# Patient Record
Sex: Male | Born: 2000 | Race: White | Hispanic: Yes | Marital: Single | Smoking: Never smoker
Health system: Southern US, Community
[De-identification: ages and names within clinical notes are randomized; demographics above are authoritative.]

---
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
## 2018-09-09 DIAGNOSIS — H40033 Anatomical narrow angle, bilateral: Secondary | ICD-10-CM | POA: Diagnosis not present

## 2018-09-09 DIAGNOSIS — H16223 Keratoconjunctivitis sicca, not specified as Sjogren's, bilateral: Secondary | ICD-10-CM | POA: Diagnosis not present

## 2018-09-23 DIAGNOSIS — H5213 Myopia, bilateral: Secondary | ICD-10-CM | POA: Diagnosis not present

## 2019-01-31 ENCOUNTER — Other Ambulatory Visit: Payer: Self-pay | Admitting: *Deleted

## 2019-01-31 DIAGNOSIS — Z20822 Contact with and (suspected) exposure to covid-19: Secondary | ICD-10-CM

## 2019-02-02 ENCOUNTER — Encounter: Payer: Self-pay | Admitting: Critical Care Medicine

## 2019-02-02 LAB — NOVEL CORONAVIRUS, NAA: SARS-CoV-2, NAA: NOT DETECTED

## 2019-08-30 ENCOUNTER — Encounter (HOSPITAL_COMMUNITY): Payer: Self-pay | Admitting: Emergency Medicine

## 2019-08-30 ENCOUNTER — Emergency Department (HOSPITAL_COMMUNITY)
Admission: EM | Admit: 2019-08-30 | Discharge: 2019-08-30 | Disposition: A | Discharge status: Home / Self Care | Payer: Medicaid Other | Attending: Emergency Medicine | Admitting: Emergency Medicine

## 2019-08-30 ENCOUNTER — Other Ambulatory Visit: Payer: Self-pay

## 2019-08-30 DIAGNOSIS — S0501XA Injury of conjunctiva and corneal abrasion without foreign body, right eye, initial encounter: Secondary | ICD-10-CM | POA: Diagnosis not present

## 2019-08-30 DIAGNOSIS — Y92511 Restaurant or cafe as the place of occurrence of the external cause: Secondary | ICD-10-CM | POA: Diagnosis not present

## 2019-08-30 DIAGNOSIS — S0591XA Unspecified injury of right eye and orbit, initial encounter: Secondary | ICD-10-CM

## 2019-08-30 DIAGNOSIS — Y999 Unspecified external cause status: Secondary | ICD-10-CM | POA: Insufficient documentation

## 2019-08-30 DIAGNOSIS — Z791 Long term (current) use of non-steroidal anti-inflammatories (NSAID): Secondary | ICD-10-CM | POA: Insufficient documentation

## 2019-08-30 DIAGNOSIS — Y939 Activity, unspecified: Secondary | ICD-10-CM | POA: Diagnosis not present

## 2019-08-30 DIAGNOSIS — W228XXA Striking against or struck by other objects, initial encounter: Secondary | ICD-10-CM | POA: Diagnosis not present

## 2019-08-30 MED ORDER — FLUORESCEIN SODIUM 1 MG OP STRP
1.0000 | ORAL_STRIP | Freq: Once | OPHTHALMIC | Status: AC
  Administered 2019-08-30: 23:00:00 1 via OPHTHALMIC

## 2019-08-30 MED ORDER — ERYTHROMYCIN 5 MG/GM OP OINT: TOPICAL_OINTMENT | OPHTHALMIC | 0 refills | Status: AC

## 2019-08-30 MED ORDER — TETRACAINE HCL 0.5 % OP SOLN
2.0000 [drp] | Freq: Once | OPHTHALMIC | Status: AC
  Administered 2019-08-30: 2 [drp] via OPHTHALMIC

## 2019-08-30 NOTE — ED Provider Notes (Signed)
[facility] EMERGENCY DEPARTMENT Provider Note   CSN: [id number] Arrival date & time: 08/30/19  2124     History Chief Complaint  Patient presents with  . Eye Injury    Clayton Taylor is a 19 y.o. male.  The history is provided by the patient. No language interpreter was used.  Eye Injury     19 year old male brought here for evaluation of eye injury.  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  He report it dislodged his contact lens and he report acute onset of sharp pain to his eye.  Pain is moderate in severity, increased with movement and he is afraid to look through his eye.  He is up-to-date with tetanus.  He denies any other injury.  No specific treatment tried.  History reviewed. No pertinent past medical history.  There are no problems to display for this patient.   History reviewed. No pertinent surgical history.     No family history on file.  Social History   Tobacco Use  . Smoking status: Never Smoker  . Smokeless tobacco: Never Used  Substance Use Topics  . Alcohol use: Never  . Drug use: Never    Home Medications Prior to Admission medications   Medication Sig Start Date End Date Taking? Authorizing Provider  acetaminophen (TYLENOL) 500 MG tablet Take 1 tablet (500 mg total) by mouth every 6 (six) hours as needed. 06/12/14   Piepenbrink, Anderson Malta, PA-C  ibuprofen (ADVIL,MOTRIN) 800 MG tablet Take 1 tablet (800 mg total) by mouth 3 (three) times daily. 06/12/14   Piepenbrink, Anderson Malta, PA-C    Allergies    Patient has no known allergies.  Review of Systems   Review of Systems  Constitutional: Negative for fever.  Eyes: Positive for photophobia, pain and redness. Negative for discharge and itching.    Physical Exam Updated Vital Signs BP 124/71 (BP Location: Left  Arm)   Pulse 73   Temp 98.4 F (36.9 C) (Oral)   Resp 16   SpO2 99%   Physical Exam Vitals and nursing note reviewed.  Constitutional:      General: He is not in acute distress.    Appearance: He is well-developed.  HENT:     Head: Atraumatic.  Eyes:     General: Lids are normal. Lids are everted, no foreign bodies appreciated. Gaze aligned appropriately. No visual field deficit.       Right eye: No foreign body, discharge or hordeolum.        Left eye: No foreign body, discharge or hordeolum.     Extraocular Movements: Extraocular movements intact.     Right eye: Normal extraocular motion.     Left eye: Normal extraocular motion.     Conjunctiva/sclera:     Right eye: Right conjunctiva is injected. No chemosis, exudate or hemorrhage.    Left eye: Left conjunctiva is not injected. No chemosis, exudate or hemorrhage.    Pupils: Pupils are equal, round, and reactive to light. Pupils are equal.     Right eye: Pupil is round, reactive and not sluggish. No corneal abrasion or fluorescein uptake. Seidel exam negative.     Slit lamp exam:    Right eye: No foreign body, hyphema or hypopyon.  Musculoskeletal:     Cervical back: Neck supple.  Skin:    Findings:  No rash.  Neurological:     Mental Status: He is alert.     ED Results / Procedures / Treatments   Labs (all labs ordered are listed, but only abnormal results are displayed) Labs Reviewed - No data to display  EKG None  Radiology No results found.  Procedures Procedures (including critical care time)  Medications Ordered in ED Medications  tetracaine (PONTOCAINE) 0.5 % ophthalmic solution 2 drop (has no administration in time range)  fluorescein ophthalmic strip 1 strip (has no administration in time range)    ED Course  I have reviewed the triage vital signs and the nursing notes.  Pertinent labs & imaging results that were available during my care of the patient were reviewed by me and considered in my  medical decision making (see chart for details).    MDM Rules/Calculators/A&P                      BP 124/71 (BP Location: Left Arm)   Pulse 73   Temp 98.4 F (36.9 C) (Oral)   Resp 16   SpO2 99%   Final Clinical Impression(s) / ED Diagnoses Final diagnoses:  Abrasion of right cornea, initial encounter  Right eye injury, initial encounter    Rx / DC Orders ED Discharge Orders         Ordered    erythromycin ophthalmic ointment     08/30/19 2310         10:23 PM Pt was accidentally poke in the eye with a drumstick prior to arrival, which dislodged his contact lens.  Pt report his vision is poor without his contact lens. He also report transient vision loss lasting nearly 2 minutes. On initial exam, no limited ROM of eye movement to suggest nerve or muscle injury.   11:04 PM After fluorescein stain pt felt better, visual is back to his baseline.  No obvious corneal abrasion noted.  Negative Seidel sign, doubt globe injury.  Normal reactive pupil.   After discussing with pt, agree to prescribe erythromycin ointment as prevention of eye infection s/p eye injury.  Recommend resume using contact lens but if he notice any evidence of impaired vision then to promptly f/u with ophthalmologist.  Currently pt report vision is at his baseline when not wearing contact lens.     Fayrene Helper, PA-C 08/30/19 2314    Sabas Sous, MD 09/03/19 [phone]

## 2019-08-30 NOTE — Discharge Instructions (Signed)
Apply erythromycin ointment as prescribed on affected eye to prevent eye infection.  Resume using your contact lens but if you notice vision changes then follow up closely with eye specialist for further evaluation.

## 2019-08-30 NOTE — ED Triage Notes (Signed)
Patient accidentally hit by a drumstick at right eye while at work this evening , no vision , reports pain with blurred vision/lacrimation .

## 2019-08-30 NOTE — ED Notes (Signed)
Visual acuity screening complete. Left eye 20/25 Right eye unable to see anything due to no contact lenses

## 2019-10-08 DIAGNOSIS — H1013 Acute atopic conjunctivitis, bilateral: Secondary | ICD-10-CM | POA: Diagnosis not present

## 2019-11-23 DIAGNOSIS — G44209 Tension-type headache, unspecified, not intractable: Secondary | ICD-10-CM | POA: Diagnosis not present

## 2019-11-23 DIAGNOSIS — H40033 Anatomical narrow angle, bilateral: Secondary | ICD-10-CM | POA: Diagnosis not present

## 2019-12-19 DIAGNOSIS — H04123 Dry eye syndrome of bilateral lacrimal glands: Secondary | ICD-10-CM | POA: Diagnosis not present

## 2019-12-20 DIAGNOSIS — H5213 Myopia, bilateral: Secondary | ICD-10-CM | POA: Diagnosis not present

## 2022-01-17 ENCOUNTER — Ambulatory Visit (HOSPITAL_COMMUNITY)
Admission: EM | Admit: 2022-01-17 | Discharge: 2022-01-17 | Disposition: A | Discharge status: Home / Self Care | Payer: Medicaid Other | Attending: Family Medicine | Admitting: Family Medicine

## 2022-01-17 ENCOUNTER — Encounter (HOSPITAL_COMMUNITY): Payer: Self-pay

## 2022-01-17 DIAGNOSIS — H6121 Impacted cerumen, right ear: Secondary | ICD-10-CM

## 2022-01-17 DIAGNOSIS — H60501 Unspecified acute noninfective otitis externa, right ear: Secondary | ICD-10-CM | POA: Diagnosis not present

## 2022-01-17 MED ORDER — AMOXICILLIN 875 MG PO TABS: 875.0000 mg | ORAL_TABLET | Freq: Two times a day (BID) | ORAL | 0 refills | Status: AC

## 2022-01-17 NOTE — ED Triage Notes (Signed)
Patient having ear fullness and decreased hearing in the right ear. States the first few days it was painful but now just feels full.   Has a history of a hole in the left ear. Would like both ears checked.

## 2022-01-17 NOTE — ED Provider Notes (Signed)
[facility]    CSN: [id number] Arrival date & time: 01/17/22  1233      History   Chief Complaint Chief Complaint  Patient presents with   Otalgia    HPI Clayton Taylor is a 21 y.o. male.   HPI Patient presents for  evaluation of right ear pain and diminished hearing x 1 week. 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 He denies any recent URI illness or allergy symptoms. No drainage from the right ear or fever.  History reviewed. No pertinent past medical history.  There are no problems to display for this patient.   History reviewed. No pertinent surgical history.     Home Medications    Prior to Admission medications   Medication Sig Start Date End Date Taking? Authorizing Provider  amoxicillin (AMOXIL) 875 MG tablet Take 1 tablet (875 mg total) by mouth 2 (two) times daily for 7 days. 01/17/22 01/24/22 Yes Bing Neighbors, FNP  acetaminophen (TYLENOL) 500 MG tablet Take 1 tablet (500 mg total) by mouth every 6 (six) hours as needed. 06/12/14   Piepenbrink, Victorino Dike, PA-C  erythromycin ophthalmic ointment Place a 1/2 inch ribbon of ointment into the lower eyelid q6hr x 5 days 08/30/19   Fayrene Helper, PA-C  ibuprofen (ADVIL,MOTRIN) 800 MG tablet Take 1 tablet (800 mg total) by mouth 3 (three) times daily. 06/12/14   Piepenbrink, Victorino Dike, PA-C    Family History History reviewed. No pertinent family history.  Social History Social History   Tobacco Use   Smoking status: Never   Smokeless tobacco: Never  Substance Use Topics   Alcohol use: Never   Drug use: Never     Allergies   Patient has no known allergies.   Review of Systems Review of Systems Pertinent negatives listed in HPI   Physical Exam Triage Vital Signs ED Triage Vitals [01/17/22 1409]  Enc Vitals Group     BP      Pulse      Resp      Temp      Temp src      SpO2      Weight 150 lb (68 kg)     Height 5\' 8"  (1.727 m)      Head Circumference      Peak Flow      Pain Score 4     Pain Loc      Pain Edu?      Excl. in GC?    No data found.  Updated Vital Signs BP (!) 109/59 (BP Location: Right Arm)   Pulse 83   Temp (!) 97.5 F (36.4 C) (Oral)   Resp 16   Ht 5\' 8"  (1.727 m)   Wt 150 lb (68 kg)   SpO2 98%   BMI 22.81 kg/m   Visual Acuity Right Eye Distance:   Left Eye Distance:   Bilateral Distance:    Right Eye Near:   Left Eye Near:    Bilateral Near:     Physical Exam Constitutional:      Appearance: Normal appearance.  HENT:     Head: Normocephalic.     Right Ear: Swelling and tenderness present. No middle ear effusion. There is impacted cerumen. Tympanic membrane is erythematous.     Left Ear: Tympanic membrane is perforated.     Nose: Nose normal.  Cardiovascular:     Rate and Rhythm: Normal rate and regular rhythm.  Pulmonary:  Effort: Pulmonary effort is normal.     Breath sounds: Normal breath sounds.  Lymphadenopathy:     Cervical: No cervical adenopathy.  Neurological:     General: No focal deficit present.   UC Treatments / Results  Labs (all labs ordered are listed, but only abnormal results are displayed) Labs Reviewed - No data to display  EKG   Radiology No results found.  Procedures Procedures (including critical care time)  Medications Ordered in UC Medications - No data to display  Initial Impression / Assessment and Plan / UC Course  I have reviewed the triage vital signs and the nursing notes.  Pertinent labs & imaging results that were available during my care of the patient were reviewed by me and considered in my medical decision making (see chart for details).    Right ear impacted and acute otitis externa Amoxicillin 875 mg BID x 7 days. Patient experienced pain with right ear irrigation performed by RN. Cerumen completely removed.  Patient concerned topical antibiotics drops will worsen pain therefore prescribed a oral antibiotic  . ENT  information provided to follow-up regarding chronic left ear TM perforation. RTC as needed. Final Clinical Impressions(s) / UC Diagnoses   Final diagnoses:  Right ear impacted cerumen  Acute otitis externa of right ear, unspecified type   Discharge Instructions   None    ED Prescriptions     Medication Sig Dispense Auth. Provider   amoxicillin (AMOXIL) 875 MG tablet Take 1 tablet (875 mg total) by mouth 2 (two) times daily for 7 days. 14 tablet Bing Neighbors, FNP      PDMP not reviewed this encounter.   Bing Neighbors, FNP 01/17/22 1454

## 2023-05-27 ENCOUNTER — Ambulatory Visit (HOSPITAL_COMMUNITY): Payer: Medicaid Other

## 2023-06-29 ENCOUNTER — Other Ambulatory Visit: Payer: Self-pay

## 2023-06-29 ENCOUNTER — Encounter (HOSPITAL_COMMUNITY): Payer: Self-pay

## 2023-06-29 ENCOUNTER — Emergency Department (HOSPITAL_COMMUNITY)
Admission: EM | Admit: 2023-06-29 | Discharge: 2023-06-30 | Disposition: A | Discharge status: Home / Self Care | Payer: Medicaid Other | Attending: Emergency Medicine | Admitting: Emergency Medicine

## 2023-06-29 DIAGNOSIS — R002 Palpitations: Secondary | ICD-10-CM | POA: Diagnosis not present

## 2023-06-29 DIAGNOSIS — R42 Dizziness and giddiness: Secondary | ICD-10-CM | POA: Diagnosis not present

## 2023-06-29 DIAGNOSIS — R55 Syncope and collapse: Secondary | ICD-10-CM | POA: Insufficient documentation

## 2023-06-29 LAB — CBC
HCT: 43.2 % (ref 39.0–52.0)
Hemoglobin: 14.2 g/dL (ref 13.0–17.0)
MCH: 26.4 pg (ref 26.0–34.0)
MCHC: 32.9 g/dL (ref 30.0–36.0)
MCV: 80.3 fL (ref 80.0–100.0)
Platelets: 293 10*3/uL (ref 150–400)
RBC: 5.38 MIL/uL (ref 4.22–5.81)
RDW: 13.9 % (ref 11.5–15.5)
WBC: 14.7 10*3/uL — ABNORMAL HIGH (ref 4.0–10.5)
nRBC: 0 % (ref 0.0–0.2)

## 2023-06-29 LAB — URINALYSIS, ROUTINE W REFLEX MICROSCOPIC
Bilirubin Urine: NEGATIVE
Glucose, UA: NEGATIVE mg/dL
Hgb urine dipstick: NEGATIVE
Ketones, ur: 20 mg/dL — AB
Leukocytes,Ua: NEGATIVE
Nitrite: NEGATIVE
Protein, ur: NEGATIVE mg/dL
Specific Gravity, Urine: 1.011 (ref 1.005–1.030)
pH: 7 (ref 5.0–8.0)

## 2023-06-29 LAB — BASIC METABOLIC PANEL
Anion gap: 12 (ref 5–15)
BUN: 12 mg/dL (ref 6–20)
CO2: 24 mmol/L (ref 22–32)
Calcium: 9.7 mg/dL (ref 8.9–10.3)
Chloride: 102 mmol/L (ref 98–111)
Creatinine, Ser: 1 mg/dL (ref 0.61–1.24)
GFR, Estimated: >60 mL/min (ref >60)
Glucose, Bld: 86 mg/dL (ref 70–99)
Potassium: 4 mmol/L (ref 3.5–5.1)
Sodium: 138 mmol/L (ref 135–145)

## 2023-06-29 LAB — CBG MONITORING, ED: Glucose-Capillary: 78 mg/dL (ref 70–99)

## 2023-06-29 NOTE — ED Triage Notes (Signed)
Pt was at work and began feeling dizzy and nauseated. Pt felt like he was "outside of his body".Symptoms have resolved now. Pt only able to describe it as a dream state. 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

## 2023-06-29 NOTE — ED Triage Notes (Signed)
Pt drinks pre workout and coffe during the day. Pt denies unilateral weakness or any other neuro symptoms

## 2023-06-30 NOTE — Discharge Instructions (Addendum)
You were seen today for near syncopal episode.  Follow-up closely with your primary doctor.  I will refer you to cardiology as you may need Holter monitoring.  Make sure you are staying hydrated.

## 2023-06-30 NOTE — ED Provider Notes (Signed)
[facility] EMERGENCY DEPARTMENT AT [facility] Provider Note   CSN: [id number] Arrival date & time: 06/29/23  2232     History  Chief Complaint  Patient presents with   Dizziness    Clayton Taylor is a 22 y.o. male.  HPI     This is a 22 year old male who presents with dizziness and near syncope.  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  He states "I felt outside of my body."  Reports that this has happened once before.  He did have some associated palpitations but no chest pain.  Reports that he thinks that he has been eating and drinking but could probably work on better hydration.  Home Medications Prior to Admission medications   Medication Sig Start Date End Date Taking? Authorizing Provider  acetaminophen (TYLENOL) 500 MG tablet Take 1 tablet (500 mg total) by mouth every 6 (six) hours as needed. 06/12/14   Piepenbrink, Victorino Dike, PA-C  erythromycin ophthalmic ointment Place a 1/2 inch ribbon of ointment into the lower eyelid q6hr x 5 days 08/30/19   Fayrene Helper, PA-C  ibuprofen (ADVIL,MOTRIN) 800 MG tablet Take 1 tablet (800 mg total) by mouth 3 (three) times daily. 06/12/14   Piepenbrink, Victorino Dike, PA-C      Allergies    Patient has no known allergies.    Review of Systems   Review of Systems  Constitutional:  Negative for fever.  Cardiovascular:  Positive for palpitations. Negative for chest pain.  Neurological:  Positive for dizziness and light-headedness.  All other systems reviewed and are negative.   Physical Exam Updated Vital Signs BP 97/68   Pulse 85   Temp 98.4 F (36.9 C)   Resp 17   Ht 1.727 m (5\' 8" )   Wt 68 kg   SpO2 98%   BMI 22.81 kg/m  Physical Exam Vitals and nursing note reviewed.  Constitutional:      Appearance: He is well-developed. He is not ill-appearing.  HENT:     Head: Normocephalic and atraumatic.  Eyes:     Pupils: Pupils are equal,  round, and reactive to light.  Cardiovascular:     Rate and Rhythm: Normal rate and regular rhythm.     Heart sounds: Normal heart sounds. No murmur heard. Pulmonary:     Effort: Pulmonary effort is normal. No respiratory distress.     Breath sounds: Normal breath sounds. No wheezing.  Abdominal:     General: Bowel sounds are normal.     Palpations: Abdomen is soft.     Tenderness: There is no abdominal tenderness. There is no rebound.  Musculoskeletal:     Cervical back: Neck supple.  Lymphadenopathy:     Cervical: No cervical adenopathy.  Skin:    General: Skin is warm and dry.  Neurological:     Mental Status: He is alert and oriented to person, place, and time.  Psychiatric:        Mood and Affect: Mood normal.     ED Results / Procedures / Treatments   Labs (all labs ordered are listed, but only abnormal results are displayed) Labs Reviewed  CBC - Abnormal; Notable for the following components:      Result Value   WBC 14.7 (*)    All other components within normal limits  URINALYSIS, ROUTINE W REFLEX MICROSCOPIC - Abnormal; Notable for the following components:   Ketones, ur 20 (*)  All other components within normal limits  BASIC METABOLIC PANEL  CBG MONITORING, ED    EKG EKG Interpretation Date/Time:  Saturday June 29 2023 23:15:18 EST Ventricular Rate:  73 PR Interval:  152 QRS Duration:  88 QT Interval:  380 QTC Calculation: 418 R Axis:   75  Text Interpretation: Normal sinus rhythm with sinus arrhythmia Normal ECG No previous ECGs available Confirmed by Ross Marcus ([id number]) on 06/30/2023 4:22:01 AM  Radiology No results found.  Procedures Procedures    Medications Ordered in ED Medications - No data to display  ED Course/ Medical Decision Making/ A&P                                 Medical Decision Making Amount and/or Complexity of Data Reviewed Labs: ordered.   This patient presents to the ED for concern of palpitations, near  syncope, this involves an extensive number of treatment options, and is a complaint that carries with it a high risk of complications and morbidity.  I considered the following differential and admission for this acute, potentially life threatening condition.  The differential diagnosis includes arrhythmia, dehydration, metabolic derangement  MDM:    This is a 22 year old male who presents with palpitations and near syncope.  He is overall nontoxic-appearing.  He is currently asymptomatic.  Reports that his heart rate went into the 110s.  Denies chest pain or shortness of breath.  Overall vital signs here are reassuring.  He did drop his blood pressure with sitting up but was asymptomatic.  Reports that he has remained asymptomatic.  Will refer him to cardiology for Holter monitoring given palpitations.  There is no significant metabolic derangements here.  He does have some ketones in the urine so I have encouraged him to hydrate.  (Labs, imaging, consults)  Labs: I Ordered, and personally interpreted labs.  The pertinent results include: CBC, BMP, urinalysis  Imaging Studies ordered: I ordered imaging studies including N/A I independently visualized and interpreted imaging. I agree with the radiologist interpretation  Additional history obtained from chart review.  External records from outside source obtained and reviewed including prior evaluations  Cardiac Monitoring: The patient was maintained on a cardiac monitor.  If on the cardiac monitor, I personally viewed and interpreted the cardiac monitored which showed an underlying rhythm of: Sinus  Reevaluation: After the interventions noted above, I reevaluated the patient and found that they have :resolved  Social Determinants of Health:  lives independently  Disposition: Discharge  Co morbidities that complicate the patient evaluation History reviewed. No pertinent past medical history.   Medicines No orders of the defined types  were placed in this encounter.   I have reviewed the patients home medicines and have made adjustments as needed  Problem List / ED Course: Problem List Items Addressed This Visit   None Visit Diagnoses     Near syncope    -  Primary   Relevant Orders   Ambulatory referral to Cardiology   Palpitations                       Final Clinical Impression(s) / ED Diagnoses Final diagnoses:  Near syncope  Palpitations    Rx / DC Orders ED Discharge Orders          Ordered    Ambulatory referral to Cardiology        06/30/23 [phone]  Shon Baton, MD 06/30/23 [phone]

## 2024-04-11 ENCOUNTER — Encounter (HOSPITAL_COMMUNITY): Payer: Self-pay

## 2024-04-11 ENCOUNTER — Ambulatory Visit (HOSPITAL_COMMUNITY)
Admission: EM | Admit: 2024-04-11 | Discharge: 2024-04-11 | Disposition: A | Discharge status: Home / Self Care | Attending: Internal Medicine | Admitting: Internal Medicine

## 2024-04-11 DIAGNOSIS — H66015 Acute suppurative otitis media with spontaneous rupture of ear drum, recurrent, left ear: Secondary | ICD-10-CM | POA: Diagnosis not present

## 2024-04-11 DIAGNOSIS — H7292 Unspecified perforation of tympanic membrane, left ear: Secondary | ICD-10-CM

## 2024-04-11 MED ORDER — OFLOXACIN 0.3 % OT SOLN: 3.0000 [drp] | Freq: Every day | OTIC | 0 refills | Status: AC

## 2024-04-11 MED ORDER — AMOXICILLIN 875 MG PO TABS: 875.0000 mg | ORAL_TABLET | Freq: Two times a day (BID) | ORAL | 0 refills | Status: AC

## 2024-04-11 NOTE — Discharge Instructions (Addendum)
 Left otitis media (middle ear infection) with chronically perforated tympanic membrane.  Will treat with antibiotics by mouth and eardrops.  Recommend following up with ENT to discuss chronic perforation and what treatment options are available.  Recommend wearing eardrops when showering or swimming to help prevent infections. Amoxicillin  875 mg twice daily for 10 days.  This is an antibiotic.  Take this with food. Ofloxacin  eardrops 3 to 4 drops in the left ear once daily for 10 days Recommend using earplugs in the left ear when swimming or taking a shower Recommend following up with ENT for chronic perforated tympanic membrane (eardrum) Return to urgent care or PCP if symptoms worsen or fail to resolve.

## 2024-04-11 NOTE — ED Triage Notes (Signed)
 Patient states he woke up with left ear fullness and pain. Patient states he noticed fluid draining out of his ear.

## 2024-04-11 NOTE — ED Provider Notes (Signed)
 [facility]    CSN: [id number] Arrival date & time: 04/11/24  1312      History   Chief Complaint Chief Complaint  Patient presents with   Ear Pain    HPI Clayton Taylor is a 23 y.o. male.   23 year old male presents urgent care with complaints of left ear pain, drainage and fullness.  He reports that he was having a little bit of ear pain yesterday but he woke up this morning with drainage all over his pillow and coming out of the ear.  The ear is very painful to the touch.  He has had recurrent ear infections and has a chronic perforated tympanic membrane.  He is wanting to follow-up with ENT but needs information regarding this as this is bothering him significantly.  He reports he gets pain anytime he gets water in the ear.  He does not wear earplugs on a regular basis when he is in the water.  He denies fevers or chills.     History reviewed. No pertinent past medical history.  There are no active problems to display for this patient.   History reviewed. No pertinent surgical history.     Home Medications    Prior to Admission medications   Medication Sig Start Date End Date Taking? Authorizing Provider  amoxicillin  (AMOXIL ) 875 MG tablet Take 1 tablet (875 mg total) by mouth 2 (two) times daily for 10 days. 04/11/24 04/21/24 Yes Kolby Schara A, PA-C  ofloxacin  (FLOXIN ) 0.3 % OTIC solution Place 3 drops into the left ear daily for 10 days. 04/11/24 04/21/24 Yes Deklynn Charlet A, PA-C  acetaminophen  (TYLENOL ) 500 MG tablet Take 1 tablet (500 mg total) by mouth every 6 (six) hours as needed. 06/12/14   Piepenbrink, Delon, PA-C  erythromycin  ophthalmic ointment Place a 1/2 inch ribbon of ointment into the lower eyelid q6hr x 5 days 08/30/19   Nivia Colon, PA-C  ibuprofen  (ADVIL ,MOTRIN ) 800 MG tablet Take 1 tablet (800 mg total) by mouth 3 (three) times daily. 06/12/14   Piepenbrink, Delon, PA-C    Family History History reviewed. No pertinent family  history.  Social History Social History   Tobacco Use   Smoking status: Never   Smokeless tobacco: Never  Substance Use Topics   Alcohol use: Never   Drug use: Never     Allergies   Patient has no known allergies.   Review of Systems Review of Systems  Constitutional:  Negative for chills and fever.  HENT:  Positive for ear discharge and ear pain. Negative for sore throat.   Eyes:  Negative for pain and visual disturbance.  Respiratory:  Negative for cough and shortness of breath.   Cardiovascular:  Negative for chest pain and palpitations.  Gastrointestinal:  Negative for abdominal pain and vomiting.  Genitourinary:  Negative for dysuria and hematuria.  Musculoskeletal:  Negative for arthralgias and back pain.  Skin:  Negative for color change and rash.  Neurological:  Negative for seizures and syncope.  All other systems reviewed and are negative.    Physical Exam Triage Vital Signs ED Triage Vitals  Encounter Vitals Group     BP 04/11/24 1506 114/64     Girls Systolic BP Percentile --      Girls Diastolic BP Percentile --      Boys Systolic BP Percentile --      Boys Diastolic BP Percentile --      Pulse Rate 04/11/24 1506 (!) 106     Resp 04/11/24  1506 20     Temp 04/11/24 1506 97.7 F (36.5 C)     Temp Source 04/11/24 1506 Oral     SpO2 04/11/24 1506 97 %     Weight --      Height --      Head Circumference --      Peak Flow --      Pain Score 04/11/24 1507 10     Pain Loc --      Pain Education --      Exclude from Growth Chart --    No data found.  Updated Vital Signs BP 114/64 (BP Location: Left Arm)   Pulse (!) 106   Temp 97.7 F (36.5 C) (Oral)   Resp 20   SpO2 97%   Visual Acuity Right Eye Distance:   Left Eye Distance:   Bilateral Distance:    Right Eye Near:   Left Eye Near:    Bilateral Near:     Physical Exam Vitals and nursing note reviewed.  Constitutional:      General: He is not in acute distress.    Appearance: He is  well-developed.  HENT:     Head: Normocephalic and atraumatic.     Right Ear: Tympanic membrane is not erythematous.     Left Ear: Tympanic membrane is perforated and erythematous.     Ears:   Eyes:     Conjunctiva/sclera: Conjunctivae normal.  Cardiovascular:     Rate and Rhythm: Normal rate and regular rhythm.     Heart sounds: No murmur heard. Pulmonary:     Effort: Pulmonary effort is normal. No respiratory distress.     Breath sounds: Normal breath sounds.  Abdominal:     Palpations: Abdomen is soft.     Tenderness: There is no abdominal tenderness.  Musculoskeletal:        General: No swelling.     Cervical back: Neck supple.  Skin:    General: Skin is warm and dry.     Capillary Refill: Capillary refill takes less than 2 seconds.  Neurological:     Mental Status: He is alert.  Psychiatric:        Mood and Affect: Mood normal.      UC Treatments / Results  Labs (all labs ordered are listed, but only abnormal results are displayed) Labs Reviewed - No data to display  EKG   Radiology No results found.  Procedures Procedures (including critical care time)  Medications Ordered in UC Medications - No data to display  Initial Impression / Assessment and Plan / UC Course  I have reviewed the triage vital signs and the nursing notes.  Pertinent labs & imaging results that were available during my care of the patient were reviewed by me and considered in my medical decision making (see chart for details).     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  Will treat with antibiotics by mouth and eardrops.  Recommend following up with ENT to discuss chronic perforation and what treatment options are available.  Recommend wearing eardrops when showering or swimming to help prevent  infections. Amoxicillin  875 mg twice daily for 10 days.  This is an antibiotic.  Take this with food. Ofloxacin  eardrops 3 to 4 drops in the left ear once daily for 10 days Recommend using earplugs in the left ear when  swimming or taking a shower Recommend following up with ENT for chronic perforated tympanic membrane (eardrum) Return to urgent care or PCP if symptoms worsen or fail to resolve.   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  Will treat with antibiotics by mouth and eardrops.  Recommend following up with ENT to discuss chronic perforation and what treatment options are available.  Recommend wearing eardrops when showering or swimming to help prevent infections. Amoxicillin  875 mg twice daily for 10 days.  This is an antibiotic.  Take this with food. Ofloxacin  eardrops 3 to 4 drops in the left ear once daily for 10 days Recommend using earplugs in the left ear when swimming or taking a shower Recommend following up with ENT for chronic perforated tympanic membrane (eardrum) Return to urgent care or PCP if symptoms worsen or fail to resolve.      ED Prescriptions     Medication Sig Dispense Auth. Provider   amoxicillin  (AMOXIL ) 875 MG tablet Take 1 tablet (875 mg total) by mouth 2 (two) times daily for 10 days. 20 tablet Crandall Harvel A, PA-C   ofloxacin  (FLOXIN ) 0.3 % OTIC solution Place 3 drops into the left ear daily for 10 days. 5 mL Teresa Almarie LABOR, [place]      PDMP not reviewed this encounter.   Teresa Almarie LABOR, PA-C 04/11/24 1546
